# Patient Record
Sex: Male | Born: 1985 | Race: Black or African American | Hispanic: No | Marital: Single | Smoking: Never smoker
Health system: Southern US, Community
[De-identification: ages and names within clinical notes are randomized; demographics above are authoritative.]

## PROBLEM LIST (undated history)

## (undated) DIAGNOSIS — J36 Peritonsillar abscess: Secondary | ICD-10-CM

---

## 2011-04-30 ENCOUNTER — Emergency Department (HOSPITAL_COMMUNITY)
Admission: EM | Admit: 2011-04-30 | Discharge: 2011-05-01 | Disposition: A | Discharge status: Home / Self Care | Payer: BC Managed Care – PPO | Attending: Emergency Medicine | Admitting: Emergency Medicine

## 2011-04-30 DIAGNOSIS — R51 Headache: Secondary | ICD-10-CM | POA: Insufficient documentation

## 2011-04-30 DIAGNOSIS — J36 Peritonsillar abscess: Secondary | ICD-10-CM | POA: Insufficient documentation

## 2011-04-30 DIAGNOSIS — M542 Cervicalgia: Secondary | ICD-10-CM | POA: Insufficient documentation

## 2011-04-30 DIAGNOSIS — R070 Pain in throat: Secondary | ICD-10-CM | POA: Insufficient documentation

## 2011-05-01 ENCOUNTER — Emergency Department (HOSPITAL_COMMUNITY): Payer: BC Managed Care – PPO

## 2011-05-01 LAB — RAPID STREP SCREEN (MED CTR MEBANE ONLY): Streptococcus, Group A Screen (Direct): NEGATIVE

## 2011-05-01 MED ORDER — IOHEXOL 300 MG/ML  SOLN
80.0000 mL | Freq: Once | INTRAMUSCULAR | Status: AC | PRN
  Administered 2011-05-01: 80 mL via INTRAVENOUS

## 2012-05-31 IMAGING — CT CT NECK W/ CM
3 of 4 series · 15 of 33 positions shown, 18 images · IV contrast (APPLIED)
Comparison: None.

CLINICAL DATA: Sore throat; difficulty swallowing and left neck
pain.

CT NECK WITH CONTRAST
TECHNIQUE: Multidetector CT imaging of the neck was performed with
intravenous contrast.
Contrast: 80 mL of Omnipaque 300 IV contrast

[Series 3: st neck 2.0 b31s · axial · 0.48mm/px · z∈[-344,-170]mm · 7 of 109 slices shown, 9 images]
[im 11/109  soft-tissue]
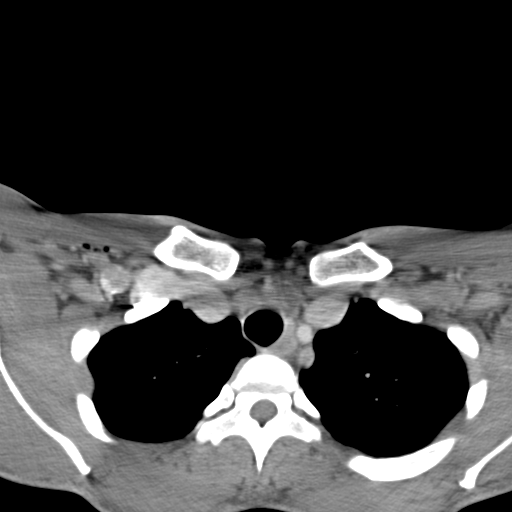
[im 11/109  bone]
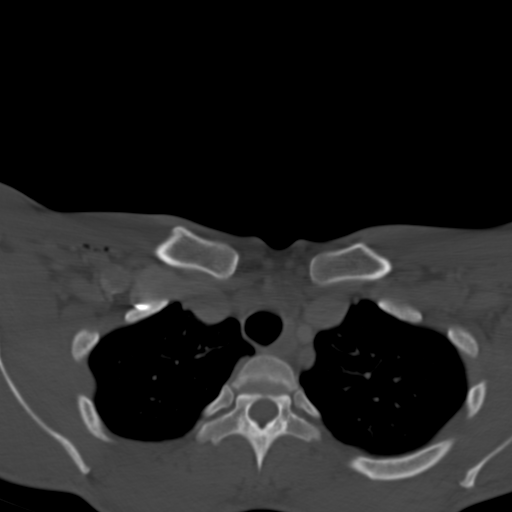
[im 22/109  bone]
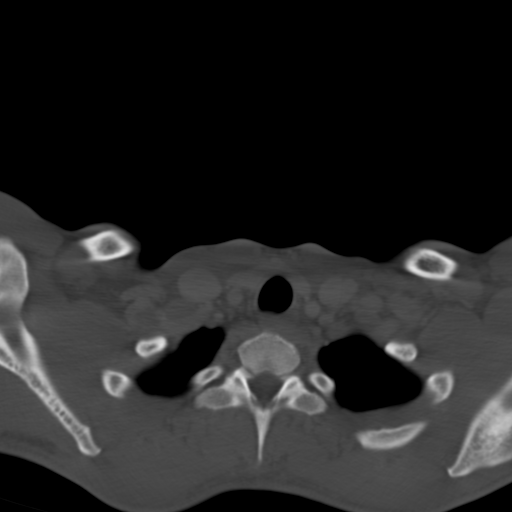
[im 44/109  bone]
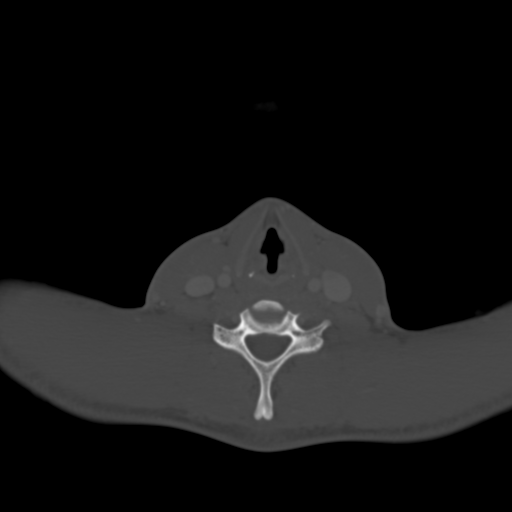
[im 55/109  bone]
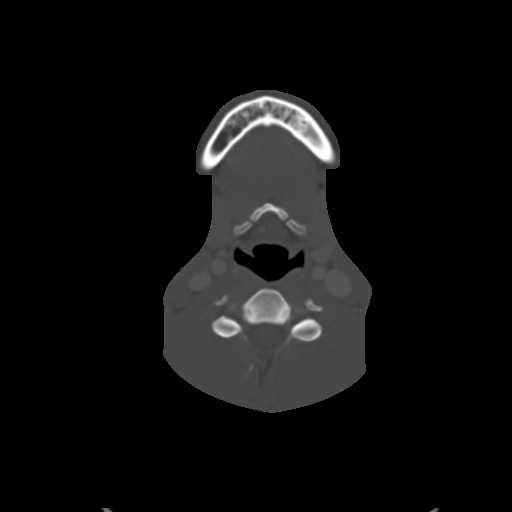
[im 65/109  soft-tissue]
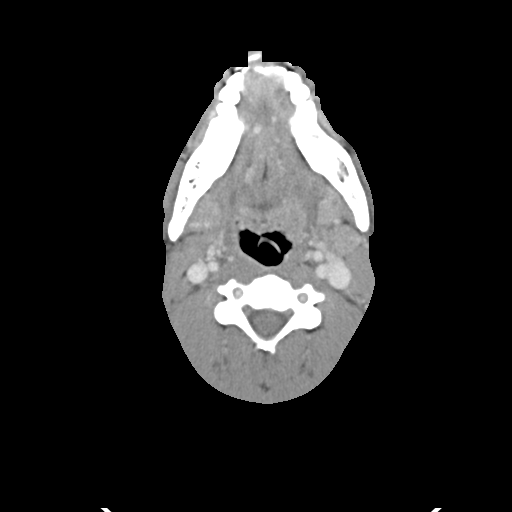
[im 65/109  bone]
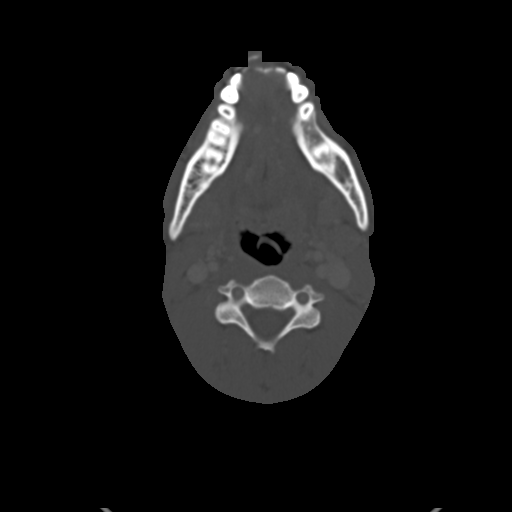
[im 87/109  bone]
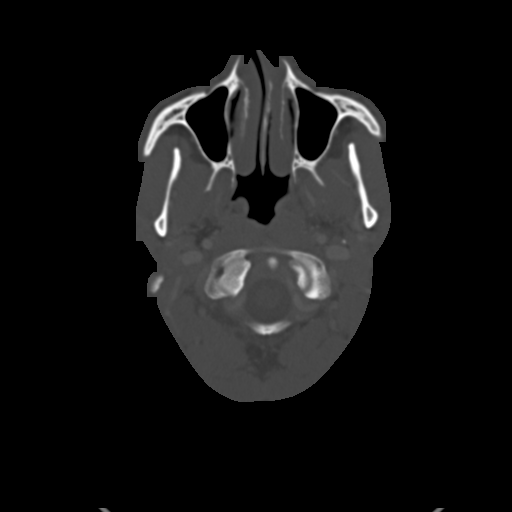
[im 98/109  bone]
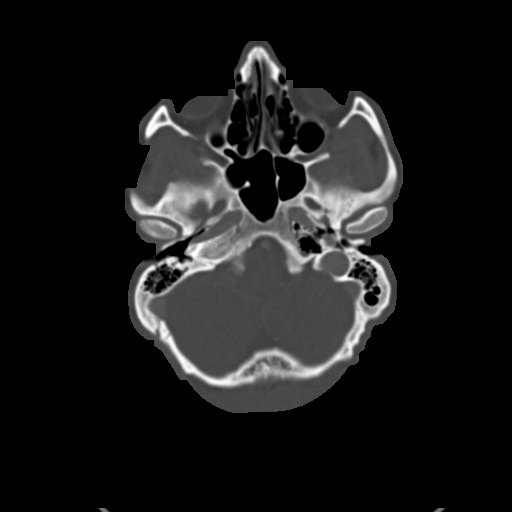

[Series 6: st neck 2.0 spo · coronal · 0.47mm/px · 3 of 64 slices shown (1 of 2)]
[im 13/64  bone]
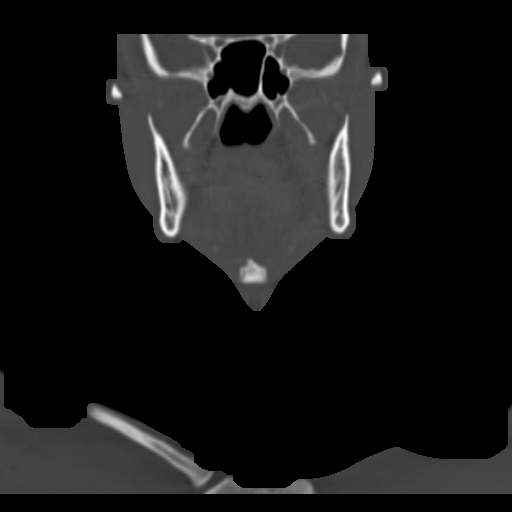
[im 26/64  bone]
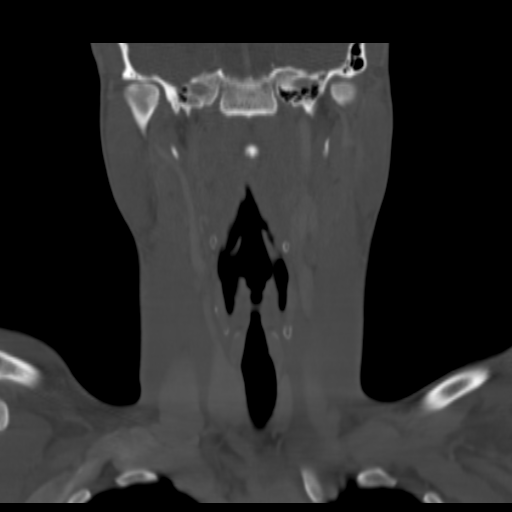
[im 38/64  bone]
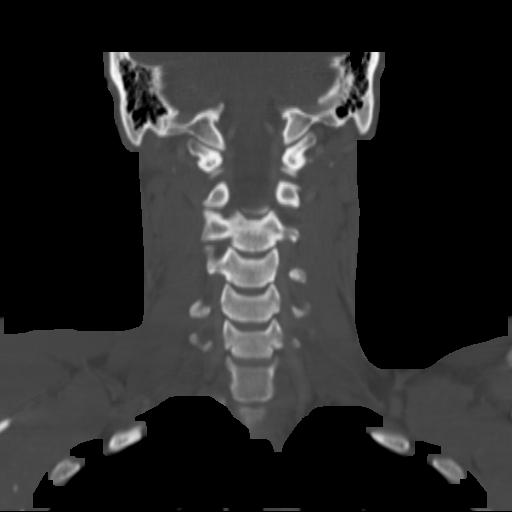

[Series 7: st neck 2.0 spo · sagittal · 0.50mm/px · 5 of 121 slices shown, 6 images (2 of 2)]
[im 41/121  bone]
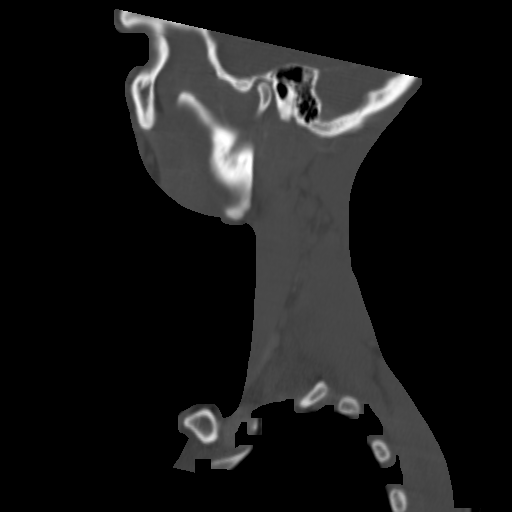
[im 51/121  bone]
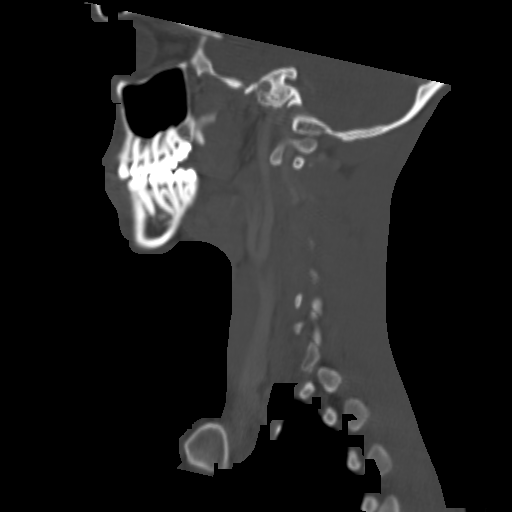
[im 61/121  soft-tissue]
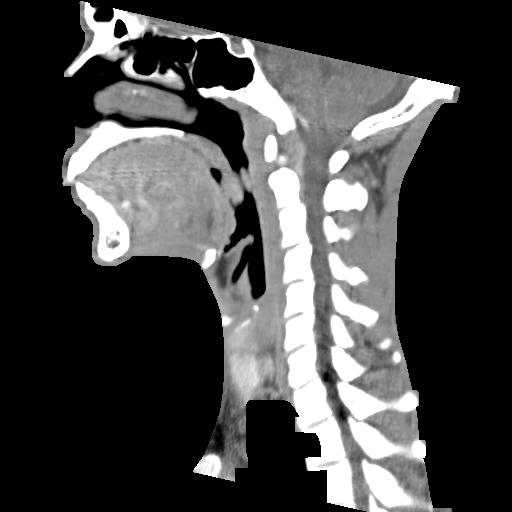
[im 61/121  bone]
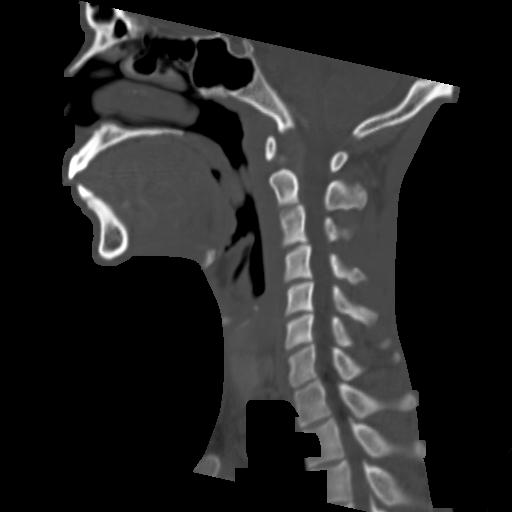
[im 71/121  bone]
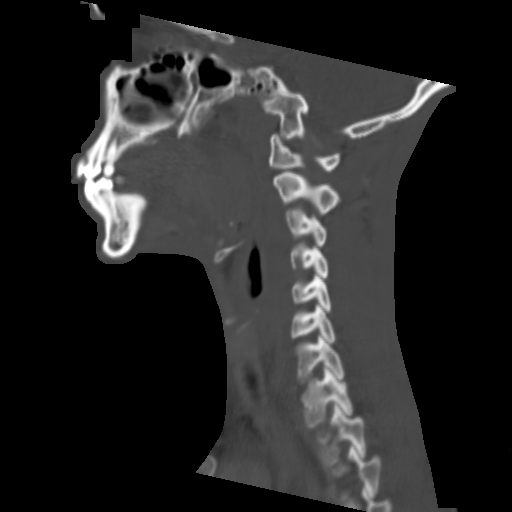
[im 81/121  bone]
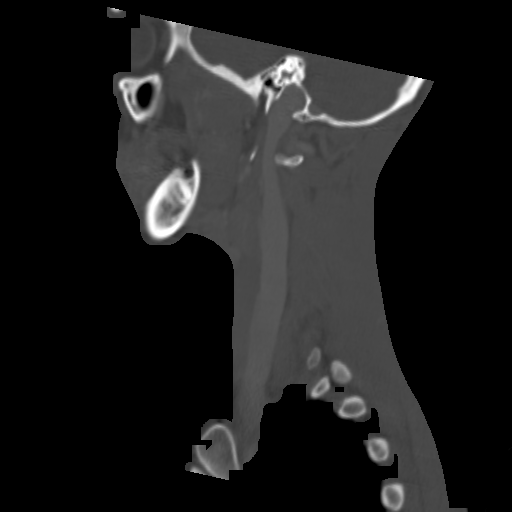

[15 of 33 positions shown; findings below may reference images not displayed]

FINDINGS: There is a 1.9 x 1.5 x 0.7 cm collection of fluid along
the lateral aspect of the left palatine tonsil, compatible with a
peritonsillar abscess.  Mild associated peripheral enhancement is
seen.  There is asymmetric edema involving the left palatine
tonsil.  There is mild edema in the surrounding soft tissues, with
haziness of the left-sided parapharyngeal fat planes.

Edema extends superiorly to the level of the adenoids on the left
side.  Associated left-sided cervical lymphadenopathy is noted,
with cervical lymph nodes measuring up to 1.2 cm in short axis.

The right-sided parapharyngeal fat planes are preserved.  There is
no definite evidence of vascular compromise.  There is mild
narrowing of the oropharynx and superior hypopharynx from the left-
sided edema.  The remainder of the hypopharynx is unremarkable; the
valleculae and piriform sinuses are within normal limits.

The thyroid gland is grossly unremarkable in appearance.  The
visualized portions of the superior mediastinum are within normal
limits; scattered small mediastinal nodes remain normal in size.
The visualized lung apices are clear.

The visualized portions of the orbits are unremarkable.  The
visualized paranasal sinuses and mastoid air cells are well-
aerated.  The visualized portions of the brain are within normal
limits.

No acute osseous abnormalities are seen.
IMPRESSION: 1.  1.9 x 1.5 x 0.7 cm peritonsillar abscess noted along the
lateral aspect of the left palatine tonsil; associated asymmetric
left-sided edema extends superiorly to the level of the adenoids,
and inferiorly along the left side of the neck.  Mild left-sided
cervical lymphadenopathy seen.
2.  Mild narrowing of the oropharynx and superior hypopharynx from
left-sided edema.

Findings were discussed with Olson PA in the ER at [[email or web address]] at

## 2012-11-18 ENCOUNTER — Emergency Department (HOSPITAL_COMMUNITY)
Admission: EM | Admit: 2012-11-18 | Discharge: 2012-11-18 | Disposition: A | Discharge status: Home / Self Care | Payer: Self-pay | Attending: Emergency Medicine | Admitting: Emergency Medicine

## 2012-11-18 ENCOUNTER — Encounter (HOSPITAL_COMMUNITY): Payer: Self-pay | Admitting: Emergency Medicine

## 2012-11-18 DIAGNOSIS — Y9229 Other specified public building as the place of occurrence of the external cause: Secondary | ICD-10-CM | POA: Insufficient documentation

## 2012-11-18 DIAGNOSIS — S46909A Unspecified injury of unspecified muscle, fascia and tendon at shoulder and upper arm level, unspecified arm, initial encounter: Secondary | ICD-10-CM | POA: Insufficient documentation

## 2012-11-18 DIAGNOSIS — IMO0002 Reserved for concepts with insufficient information to code with codable children: Secondary | ICD-10-CM | POA: Insufficient documentation

## 2012-11-18 DIAGNOSIS — Y990 Civilian activity done for income or pay: Secondary | ICD-10-CM | POA: Insufficient documentation

## 2012-11-18 DIAGNOSIS — Y9389 Activity, other specified: Secondary | ICD-10-CM | POA: Insufficient documentation

## 2012-11-18 DIAGNOSIS — X503XXA Overexertion from repetitive movements, initial encounter: Secondary | ICD-10-CM | POA: Insufficient documentation

## 2012-11-18 NOTE — ED Notes (Signed)
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  Patient does not remember a specific injury.  Pain became worse gradually.  Patient has taken Motrin, Aleve with good results but did not keep taking it.  Patient describes the back pain as an ache, but the shoulder pain is sharp.

## 2012-11-18 NOTE — ED Provider Notes (Signed)
History    This chart was scribed for Darnelle Going, a non-physician practitioner working with Gilda Crease, * by Lewanda Rife, ED Scribe. This patient was seen in room WTR7/WTR7 and the patient's care was started at 1751.     CSN: [id number]  Arrival date & time 11/18/12  1719   First MD Initiated Contact with Patient 11/18/12 1732      Chief Complaint  Patient presents with  . Shoulder Pain    (Consider location/radiation/quality/duration/timing/severity/associated sxs/prior treatment) HPI Eric Valenzuela is a 27 y.o. male who presents to the Emergency Department complaining of worsening constant lower back pain radiating to left shoulder onset 7 days. 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 Pt denies any recent injuries, paraesthesias, and weakness. Pt reports taking ibuprofen daily at home to treat pain with mild relief of symptoms.  History reviewed. No pertinent past medical history.  History reviewed. No pertinent past surgical history.  No family history on file.  History  Substance Use Topics  . Smoking status: Not on file  . Smokeless tobacco: Not on file  . Alcohol Use: Not on file      Review of Systems  Constitutional: Negative.   HENT: Negative.   Respiratory: Negative.   Cardiovascular: Negative.   Gastrointestinal: Negative.   Musculoskeletal: Positive for myalgias.  Skin: Negative.   Neurological: Negative.   Psychiatric/Behavioral: Negative.   All other systems reviewed and are negative.   A complete 10 system review of systems was obtained and all systems are negative except as noted in the HPI and PMH.    Allergies  Review of patient's allergies indicates no known allergies.  Home Medications   Current Outpatient Rx  Name  Route  Sig  Dispense  Refill  . ibuprofen (ADVIL,MOTRIN) 200 MG tablet   Oral   Take 400 mg by mouth every 6 (six) hours as needed for pain.         . naproxen  sodium (ANAPROX) 220 MG tablet   Oral   Take 220 mg by mouth 2 (two) times daily as needed.           BP 105/63  Pulse 81  Temp(Src) 97.1 F (36.2 C) (Oral)  Resp 20  Wt 155 lb (70.308 kg)  SpO2 97%  Physical Exam  Nursing note and vitals reviewed. Constitutional: He is oriented to person, place, and time. He appears well-developed and well-nourished. No distress.  HENT:  Head: Normocephalic and atraumatic.  Eyes: EOM are normal.  Neck: Neck supple. No tracheal deviation present.  Cardiovascular: Normal rate.   Pulmonary/Chest: Effort normal. No respiratory distress.  Musculoskeletal: Normal range of motion.       Left shoulder: He exhibits tenderness, pain and spasm. He exhibits normal range of motion, no bony tenderness, no swelling, no effusion, no crepitus, no deformity, no laceration, normal pulse and normal strength.       Cervical back: Normal.  Bilateral trapezius spasms   Neurological: He is alert and oriented to person, place, and time.  Skin: Skin is warm and dry.  Psychiatric: He has a normal mood and affect. His behavior is normal.    ED Course  Procedures (including critical care time) Medications - No data to display  Labs Reviewed - No data to display No results found.   1. Shoulder joint pain, left       MDM   Referral to Ortho given. Pt has been advised of the symptoms that warrant  their return to the ED. Patient has voiced understanding and has agreed to follow-up with the PCP or specialist.   I personally performed the services described in this documentation, which was scribed in my presence. The recorded information has been reviewed and is accurate.    Dorthula Matas, PA-C 11/19/12 1201

## 2012-11-19 NOTE — ED Provider Notes (Signed)
Medical screening examination/treatment/procedure(s) were performed by non-physician practitioner and as supervising physician I was immediately available for consultation/collaboration.   Nelia Shi, MD 11/19/12 [phone]

## 2014-07-20 ENCOUNTER — Encounter (HOSPITAL_BASED_OUTPATIENT_CLINIC_OR_DEPARTMENT_OTHER): Payer: Self-pay

## 2014-07-20 ENCOUNTER — Emergency Department (HOSPITAL_BASED_OUTPATIENT_CLINIC_OR_DEPARTMENT_OTHER)
Admission: EM | Admit: 2014-07-20 | Discharge: 2014-07-20 | Disposition: A | Discharge status: Home / Self Care | Payer: BC Managed Care – PPO | Attending: Emergency Medicine | Admitting: Emergency Medicine

## 2014-07-20 DIAGNOSIS — J029 Acute pharyngitis, unspecified: Secondary | ICD-10-CM | POA: Insufficient documentation

## 2014-07-20 HISTORY — DX: Peritonsillar abscess: J36

## 2014-07-20 LAB — RAPID STREP SCREEN (MED CTR MEBANE ONLY): Streptococcus, Group A Screen (Direct): NEGATIVE

## 2014-07-20 MED ORDER — AMOXICILLIN 500 MG PO CAPS: 1000.0000 mg | ORAL_CAPSULE | Freq: Two times a day (BID) | ORAL | Status: AC

## 2014-07-20 NOTE — Discharge Instructions (Signed)

## 2014-07-20 NOTE — ED Provider Notes (Signed)
CSN: [id number]     Arrival date & time 07/20/14  1031 History   First MD Initiated Contact with Patient 07/20/14 1214     Chief Complaint  Patient presents with  . Sore Throat     (Consider location/radiation/quality/duration/timing/severity/associated sxs/prior Treatment) HPI The patient reports that he developed some mild discomfort on the left side of his throat yesterday. He reports that this morning however it was more painful and now swallowing is causing a sharp pain. The patient does not have any associated neck stiffness. No difficulty breathing. 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 The last one occurred about 2 years ago. He reports that they started like this and he is concerned this may be the beginning of that. He has not had associated fever. He reports otherwise he has been feeling well. Past Medical History  Diagnosis Date  . Peritonsillar abscess    History reviewed. No pertinent past surgical history. No family history on file. History  Substance Use Topics  . Smoking status: Never Smoker   . Smokeless tobacco: Not on file  . Alcohol Use: No    Review of Systems 10 Systems reviewed and are negative for acute change except as noted in the HPI.    Allergies  Review of patient's allergies indicates no known allergies.  Home Medications   Prior to Admission medications   Medication Sig Start Date End Date Taking? Authorizing Provider  amoxicillin (AMOXIL) 500 MG capsule Take 2 capsules (1,000 mg total) by mouth 2 (two) times daily. 07/20/14   Arby BarretteMarcy Natisha Trzcinski, MD  naproxen sodium (ANAPROX) 220 MG tablet Take 220 mg by mouth 2 (two) times daily as needed.    Historical Provider, MD   BP 120/89 mmHg  Pulse 65  Temp(Src) 98.7 F (37.1 C) (Oral)  Resp 16  Ht 5\' 10"  (1.778 m)  Wt 160 lb (72.576 kg)  BMI 22.96 kg/m2  SpO2 99% Physical Exam  Constitutional: He is oriented to person, place, and time. He appears  well-developed and well-nourished.  Patient is nontoxic and alert. His voice is clear his mental status is normal  HENT:  Head: Normocephalic and atraumatic.  Right Ear: External ear normal.  Left Ear: External ear normal.  Nose: Nose normal.  Mouth/Throat: No oropharyngeal exudate.  At this point there is no significant oropharyngeal finding. There is some questionable erythema to the left side of the tonsillar pillar. However at this point there is no fullness or swelling present. There is no exudate present. The airway is widely patent. The dentition is in good condition. Remainder of the mucous memories are pink and moist.  Eyes: EOM are normal. Pupils are equal, round, and reactive to light.  Neck:  The patient's neck is supple. He does endorse some discomfort over the tonsillar lymph node on the left. However at this time it is not inflamed or enlarged. There is no meningismus.  Cardiovascular: Normal rate, regular rhythm and normal heart sounds.   Pulmonary/Chest: Effort normal and breath sounds normal. No respiratory distress.  Neurological: He is alert and oriented to person, place, and time.  Skin: Skin is warm and dry.  Psychiatric: He has a normal mood and affect.    ED Course  Procedures (including critical care time) Labs Review Labs Reviewed  RAPID STREP SCREEN  CULTURE, GROUP A STREP    Imaging Review No results found.   EKG Interpretation None      MDM   Final  diagnoses:  Acute pharyngitis, unspecified pharyngitis type   Patient has history of peritonsillar abscess. At this point the findings are not suggestive of a fluid collection. However based on the patient's history of similar and onset of a one-sided pharyngeal pain syndrome I will treat the patient empirically with amoxicillin and have him follow-up within the next 2 days. Patient is well aware of the symptoms for which to return.    Arby BarretteMarcy Shaquisha Wynn, MD 07/20/14 [phone]

## 2014-07-20 NOTE — ED Notes (Addendum)
C/o sore throat x 2 weeks-concerned due to hx of peritonsillar abscess-left tonsil is pink with slight  Swelling-pt NAD

## 2014-07-22 LAB — CULTURE, GROUP A STREP

## 2014-10-24 ENCOUNTER — Encounter (HOSPITAL_BASED_OUTPATIENT_CLINIC_OR_DEPARTMENT_OTHER): Payer: Self-pay | Admitting: Emergency Medicine

## 2014-10-24 ENCOUNTER — Emergency Department (HOSPITAL_BASED_OUTPATIENT_CLINIC_OR_DEPARTMENT_OTHER)
Admission: EM | Admit: 2014-10-24 | Discharge: 2014-10-24 | Disposition: A | Discharge status: Home / Self Care | Payer: No Typology Code available for payment source | Attending: Emergency Medicine | Admitting: Emergency Medicine

## 2014-10-24 ENCOUNTER — Emergency Department (HOSPITAL_BASED_OUTPATIENT_CLINIC_OR_DEPARTMENT_OTHER): Payer: No Typology Code available for payment source

## 2014-10-24 DIAGNOSIS — S4992XA Unspecified injury of left shoulder and upper arm, initial encounter: Secondary | ICD-10-CM | POA: Diagnosis present

## 2014-10-24 DIAGNOSIS — Y9389 Activity, other specified: Secondary | ICD-10-CM | POA: Diagnosis not present

## 2014-10-24 DIAGNOSIS — Z8709 Personal history of other diseases of the respiratory system: Secondary | ICD-10-CM | POA: Diagnosis not present

## 2014-10-24 DIAGNOSIS — Y92410 Unspecified street and highway as the place of occurrence of the external cause: Secondary | ICD-10-CM | POA: Insufficient documentation

## 2014-10-24 DIAGNOSIS — S46919A Strain of unspecified muscle, fascia and tendon at shoulder and upper arm level, unspecified arm, initial encounter: Secondary | ICD-10-CM

## 2014-10-24 DIAGNOSIS — S7002XA Contusion of left hip, initial encounter: Secondary | ICD-10-CM | POA: Insufficient documentation

## 2014-10-24 DIAGNOSIS — S46912A Strain of unspecified muscle, fascia and tendon at shoulder and upper arm level, left arm, initial encounter: Secondary | ICD-10-CM | POA: Insufficient documentation

## 2014-10-24 DIAGNOSIS — Y998 Other external cause status: Secondary | ICD-10-CM | POA: Diagnosis not present

## 2014-10-24 DIAGNOSIS — T148XXA Other injury of unspecified body region, initial encounter: Secondary | ICD-10-CM

## 2014-10-24 DIAGNOSIS — Z792 Long term (current) use of antibiotics: Secondary | ICD-10-CM | POA: Diagnosis not present

## 2014-10-24 MED ORDER — IBUPROFEN 600 MG PO TABS: 600.0000 mg | ORAL_TABLET | Freq: Four times a day (QID) | ORAL | Status: AC | PRN

## 2014-10-24 MED ORDER — HYDROCODONE-ACETAMINOPHEN 5-325 MG PO TABS
1.0000 | ORAL_TABLET | Freq: Once | ORAL | Status: AC
Start: 2014-10-24 — End: 2014-10-24
  Administered 2014-10-24: 1 via ORAL
  Filled 2014-10-24: qty 1

## 2014-10-24 MED ORDER — HYDROCODONE-ACETAMINOPHEN 5-325 MG PO TABS: 1.0000 | ORAL_TABLET | Freq: Four times a day (QID) | ORAL | Status: AC | PRN

## 2014-10-24 MED ORDER — IBUPROFEN 400 MG PO TABS
600.0000 mg | ORAL_TABLET | Freq: Once | ORAL | Status: AC
  Administered 2014-10-24: 600 mg via ORAL
  Filled 2014-10-24 (×2): qty 1

## 2014-10-24 MED ORDER — METHOCARBAMOL 500 MG PO TABS: 500.0000 mg | ORAL_TABLET | Freq: Two times a day (BID) | ORAL | Status: AC
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
